# Patient Record
Sex: Female | Born: 2000 | Race: Black or African American | Hispanic: No | Marital: Single | Smoking: Never smoker
Health system: Southern US, Community
[De-identification: ages and names within clinical notes are randomized; demographics above are authoritative.]

## PROBLEM LIST (undated history)

## (undated) HISTORY — PX: WISDOM TOOTH EXTRACTION: SHX21

---
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
## 2018-04-24 ENCOUNTER — Ambulatory Visit (INDEPENDENT_AMBULATORY_CARE_PROVIDER_SITE_OTHER): Admitting: Family Medicine

## 2018-04-24 ENCOUNTER — Other Ambulatory Visit: Payer: Self-pay

## 2018-04-24 ENCOUNTER — Encounter: Payer: Self-pay | Admitting: Family Medicine

## 2018-04-24 VITALS — BP 112/68 | HR 60 | Temp 98.7°F | Resp 16 | Ht 65.0 in | Wt 124.4 lb

## 2018-04-24 DIAGNOSIS — Z0000 Encounter for general adult medical examination without abnormal findings: Secondary | ICD-10-CM

## 2018-04-24 NOTE — Progress Notes (Signed)
Subjective:    Patient ID: Megan Burke, female    DOB: 01-23-2001, 18 y.o.   MRN: [id number]  HPI   Patient presents to clinic to establish with PCP.  Currently has no complaints and would like complete physical exam.  Past medical history, surgical, social and family history reviewed and updated accordingly in chart.  Patient has no chronic medical conditions and is currently on no medications.  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  Vaccines are up-to-date for her age.  He does not smoke or drink alcohol, does not do drugs.  Sees the dentist twice per year, has not seen eye doctor since high school.  Currently wears no glasses or contacts.  History reviewed. No pertinent past medical history.   Family History  Problem Relation Age of Onset  . Depression Mother   . Hypertension Maternal Grandmother    History reviewed. No pertinent surgical history.   Social History   Tobacco Use  . Smoking status: Never Smoker  . Smokeless tobacco: Never Used  Substance Use Topics  . Alcohol use: Never    Frequency: Never   Review of Systems  Constitutional: Negative for chills, fatigue and fever.  HENT: Negative for congestion, ear pain, sinus pain and sore throat.   Eyes: Negative.   Respiratory: Negative for cough, shortness of breath and wheezing.   Cardiovascular: Negative for chest pain, palpitations and leg swelling.  Gastrointestinal: Negative for abdominal pain, diarrhea, nausea and vomiting.  Genitourinary: Negative for dysuria, frequency and urgency.  Musculoskeletal: Negative for arthralgias and myalgias.  Skin: Negative for color change, pallor and rash.  Neurological: Negative for syncope, light-headedness and headaches.  Psychiatric/Behavioral: The patient is not nervous/anxious.       Objective:   Physical Exam  Constitutional: She appears well-developed and well-nourished. No  distress.  HENT:  Head: Normocephalic and atraumatic.  Eyes: Pupils are equal, round, and reactive to light. EOM are normal. No scleral icterus.  Neck: Normal range of motion. Neck supple. No tracheal deviation present.  Cardiovascular: Normal rate, regular rhythm and normal heart sounds.  Pulmonary/Chest: Effort normal and breath sounds normal. No respiratory distress. She has no wheezes. She has no rales.  Abdominal: Soft. Bowel sounds are normal. There is no tenderness.  Neurological: She is alert and oriented to person, place, and time.  Gait normal  Skin: Skin is warm and dry. No pallor.  Psychiatric: She has a normal mood and affect. Her behavior is normal. Thought content normal.   Nursing note and vitals reviewed.   Today's Vitals   04/24/18 0939  BP: 112/68  Pulse: 60  Resp: 16  Temp: 98.7 F (37.1 C)  TempSrc: Oral  SpO2: 93%  Weight: 124 lb 6.4 oz (56.4 kg)  Height: 5\' 5"  (1.651 m)   Body mass index is 20.7 kg/m.     Assessment & Plan:   Well adult exam -offered to do screening labs for baseline numbers for CBC, CMP, thyroid, vitamin D and B12 levels however patient declines.  Declines any STD screenings.  Discussed healthy diet and regular exercise, always wearing seatbelt in vehicle, use of condoms for STD prevention and pregnancy prevention, safe sun practices including wearing SPF of at least 30 when outdoors, long sleeves and widebrimmed hat to protect skin.  Patient is a healthy 18 year old female.  Pap smear due next year  Patient will follow-up annually for physical exam, and will  return to clinic sooner if any issues arise.

## 2018-07-17 ENCOUNTER — Other Ambulatory Visit: Payer: Self-pay

## 2018-07-17 ENCOUNTER — Ambulatory Visit: Admitting: Family Medicine

## 2018-07-17 DIAGNOSIS — R21 Rash and other nonspecific skin eruption: Secondary | ICD-10-CM

## 2018-07-17 MED ORDER — METHYLPREDNISOLONE 4 MG PO TBPK: ORAL_TABLET | ORAL | 0 refills | Status: DC

## 2018-07-17 NOTE — Progress Notes (Signed)
Patient ID: Megan Burke, female   DOB: 06/09/2000, 18 y.o.   MRN: [id number]    Virtual Visit via video Note  This visit type was conducted due to national recommendations for restrictions regarding the COVID-19 pandemic (e.g. social distancing).  This format is felt to be most appropriate for this patient at this time.  All issues noted in this document were discussed and addressed.  No physical exam was performed (except for noted visual exam findings with Video Visits).   I connected with Megan Burke today at  2:00 PM EDT by a video enabled telemedicine application and verified that I am speaking with the correct person using two identifiers. Location patient: home Location provider: [facility] Persons participating in the virtual visit: patient, provider  I discussed the limitations, risks, security and privacy concerns of performing an evaluation and management service by video and the availability of in person appointments. I also discussed with the patient that there may be a patient responsible charge related to this service. The patient expressed understanding and agreed to proceed.  HPI:  Patient and I connected via video due to complaint of rash that is all over her stomach and back.  Patient denies any new soaps, detergents or lotions.  Has not been any new places or eaten new foods.  States she was over at her aunts home and did stay over there for a few days however she brought her own soap to use when showering.  As far as patient knows she has no allergies and has never been someone who has had super sensitive skin.  Denies any possibility of contact with poison ivy or poison oak.  No fever or chills.  No nausea or vomiting.  No pustules or blisters on skin.  No GI or GU complaints.  No cough, shortness of breath or wheezing.  ROS: See pertinent positives and negatives per HPI.  Family History  Problem Relation Age of Onset  . Depression Mother   . Hypertension  Maternal Grandmother     Social History   Tobacco Use  . Smoking status: Never Smoker  . Smokeless tobacco: Never Used  Substance Use Topics  . Alcohol use: Never    Frequency: Never    Current Outpatient Medications:  .  methylPREDNISolone (MEDROL) 4 MG TBPK tablet, Take according to pack instructions, Disp: 21 tablet, Rfl: 0  EXAM:  GENERAL: alert, oriented, appears well and in no acute distress  HEENT: atraumatic, conjunttiva clear, no obvious abnormalities on inspection of external nose and ears  NECK: normal movements of the head and neck  LUNGS: on inspection no signs of respiratory distress, breathing rate appears normal, no obvious gross SOB, gasping or wheezing  CV: no obvious cyanosis  MS: moves all visible extremities without noticeable abnormality  SKIN: Scattered faint, slightly raised maculopapular rash across patient's abdomen and back.  No obvious vesicles or pustules.  Skin does not appear angry red.  Patient states skin feels normal temperature.  PSYCH/NEURO: pleasant and cooperative, no obvious depression or anxiety, speech and thought processing grossly intact  ASSESSMENT AND PLAN:  Discussed the following assessment and plan:  Rash and nonspecific skin eruption - Plan: methylPREDNISolone (MEDROL) 4 MG TBPK tablet  Due to widespread area of the rash I would prefer not to use a topical steroid cream for fear of it discoloring patient's patient's skin.  She will take oral steroid taper and also has been advised to take a oral antihistamine daily such as Claritin or Allegra  to calm down the rash.  Advised to monitor cell for any worsening or rash and monitor cell for anything that potentially seems to make rash worse.  If rash does not clear up with use of steroid and oral antihistamine our next plan would be to refer to dermatology.  Patient verbalizes agreement of this plan.  I discussed the assessment and treatment plan with the patient. The patient was  provided an opportunity to ask questions and all were answered. The patient agreed with the plan and demonstrated an understanding of the instructions.   The patient was advised to call back or seek an in-person evaluation if the symptoms worsen or if the condition fails to improve as anticipated.   Tracey Harries, FNP

## 2019-04-26 ENCOUNTER — Encounter: Admitting: Family Medicine

## 2021-01-09 ENCOUNTER — Telehealth: Payer: Self-pay | Admitting: Adult Health

## 2021-01-09 ENCOUNTER — Encounter: Payer: Self-pay | Admitting: Adult Health

## 2021-01-09 ENCOUNTER — Ambulatory Visit (INDEPENDENT_AMBULATORY_CARE_PROVIDER_SITE_OTHER): Admitting: Adult Health

## 2021-01-09 ENCOUNTER — Other Ambulatory Visit: Payer: Self-pay

## 2021-01-09 VITALS — BP 124/78 | HR 92 | Temp 97.5°F | Ht 65.0 in | Wt 139.4 lb

## 2021-01-09 DIAGNOSIS — K219 Gastro-esophageal reflux disease without esophagitis: Secondary | ICD-10-CM | POA: Insufficient documentation

## 2021-01-09 DIAGNOSIS — R197 Diarrhea, unspecified: Secondary | ICD-10-CM | POA: Diagnosis not present

## 2021-01-09 DIAGNOSIS — R1011 Right upper quadrant pain: Secondary | ICD-10-CM | POA: Insufficient documentation

## 2021-01-09 DIAGNOSIS — R630 Anorexia: Secondary | ICD-10-CM | POA: Diagnosis not present

## 2021-01-09 DIAGNOSIS — R112 Nausea with vomiting, unspecified: Secondary | ICD-10-CM | POA: Insufficient documentation

## 2021-01-09 LAB — COMPREHENSIVE METABOLIC PANEL
ALT: 7 U/L (ref 0–35)
AST: 14 U/L (ref 0–37)
Albumin: 5 g/dL (ref 3.5–5.2)
Alkaline Phosphatase: 42 U/L (ref 39–117)
BUN: 8 mg/dL (ref 6–23)
CO2: 26 mEq/L (ref 19–32)
Calcium: 10.2 mg/dL (ref 8.4–10.5)
Chloride: 100 mEq/L (ref 96–112)
Creatinine, Ser: 0.77 mg/dL (ref 0.40–1.20)
GFR: 109.25 mL/min (ref >60.00)
Glucose, Bld: 75 mg/dL (ref 70–99)
Potassium: 3.7 mEq/L (ref 3.5–5.1)
Sodium: 137 mEq/L (ref 135–145)
Total Bilirubin: 1 mg/dL (ref 0.2–1.2)
Total Protein: 7.8 g/dL (ref 6.0–8.3)

## 2021-01-09 LAB — TSH: TSH: 0.6 u[IU]/mL (ref 0.35–5.50)

## 2021-01-09 LAB — LIPID PANEL
Cholesterol: 192 mg/dL (ref 0–200)
HDL: 47.1 mg/dL (ref >39.00)
LDL Cholesterol: 131 mg/dL — ABNORMAL HIGH (ref 0–99)
NonHDL: 145.34
Total CHOL/HDL Ratio: 4
Triglycerides: 74 mg/dL (ref 0.0–149.0)
VLDL: 14.8 mg/dL (ref 0.0–40.0)

## 2021-01-09 LAB — CBC WITH DIFFERENTIAL/PLATELET
Basophils Absolute: 0 10*3/uL (ref 0.0–0.1)
Basophils Relative: 1.1 % (ref 0.0–3.0)
Eosinophils Absolute: 0 10*3/uL (ref 0.0–0.7)
Eosinophils Relative: 0.7 % (ref 0.0–5.0)
HCT: 44.9 % (ref 36.0–46.0)
Hemoglobin: 14.8 g/dL (ref 12.0–15.0)
Lymphocytes Relative: 29.9 % (ref 12.0–46.0)
Lymphs Abs: 1 10*3/uL (ref 0.7–4.0)
MCHC: 33 g/dL (ref 30.0–36.0)
MCV: 94.6 fl (ref 78.0–100.0)
Monocytes Absolute: 0.2 10*3/uL (ref 0.1–1.0)
Monocytes Relative: 5.5 % (ref 3.0–12.0)
Neutro Abs: 2.2 10*3/uL (ref 1.4–7.7)
Neutrophils Relative %: 62.8 % (ref 43.0–77.0)
Platelets: 215 10*3/uL (ref 150.0–400.0)
RBC: 4.74 Mil/uL (ref 3.87–5.11)
RDW: 13.2 % (ref 11.5–15.5)
WBC: 3.5 10*3/uL — ABNORMAL LOW (ref 4.0–10.5)

## 2021-01-09 LAB — LIPASE: Lipase: 26 U/L (ref 11.0–59.0)

## 2021-01-09 LAB — AMYLASE: Amylase: 57 U/L (ref 27–131)

## 2021-01-09 LAB — HCG, QUANTITATIVE, PREGNANCY: Quantitative HCG: <0.6 m[IU]/mL

## 2021-01-09 MED ORDER — PANTOPRAZOLE SODIUM 20 MG PO TBEC: 20.0000 mg | DELAYED_RELEASE_TABLET | Freq: Every day | ORAL | 0 refills | Status: DC

## 2021-01-09 NOTE — Telephone Encounter (Signed)
Lft sms alert for pt to call ofc to sch [place]. thanks

## 2021-01-09 NOTE — Progress Notes (Signed)
CMP is within normal limits.  Amylase and lipase within normal.  CMP within normal.  LDL elevated.  Discuss lifestyle modification with patient e.g. increase exercise, fiber, fruits, vegetables, lean meat, and omega 3/fish intake and decrease saturated fat.  If patient following strict diet and exercise program already please schedule follow up appointment with primary care physician  TSH is low end normal.  White blood cell count slightly low, will recheck.  Recheck  CBC and TSH in 4- 6 weeks.  please schedule.  HCG for pregnancy is negative.

## 2021-01-09 NOTE — Patient Instructions (Signed)
Food Choices for Gastroesophageal Reflux Disease, Adult When you have gastroesophageal reflux disease (GERD), the foods you eat and your eating habits are very important. Choosing the right foods can help ease your discomfort. Think about working with a food expert (dietitian) to help you make good choices. What are tips for following this plan? Reading food labels Look for foods that are low in saturated fat. Foods that may help with your symptoms include: Foods that have less than 5% of daily value (DV) of fat. Foods that have 0 grams of trans fat. Cooking Do not fry your food. Cook your food by baking, steaming, grilling, or broiling. These are all methods that do not need a lot of fat for cooking. To add flavor, try to use herbs that are low in spice and acidity. Meal planning  Choose healthy foods that are low in fat, such as: Fruits and vegetables. Whole grains. Low-fat dairy products. Lean meats, fish, and poultry. Eat small meals often instead of eating 3 large meals each day. Eat your meals slowly in a place where you are relaxed. Avoid bending over or lying down until 2-3 hours after eating. Limit high-fat foods such as fatty meats or fried foods. Limit your intake of fatty foods, such as oils, butter, and shortening. Avoid the following as told by your doctor: Foods that cause symptoms. These may be different for different people. Keep a food diary to keep track of foods that cause symptoms. Alcohol. Drinking a lot of liquid with meals. Eating meals during the 2-3 hours before bed. Lifestyle Stay at a healthy weight. Ask your doctor what weight is healthy for you. If you need to lose weight, work with your doctor to do so safely. Exercise for at least 30 minutes on 5 or more days each week, or as told by your doctor. Wear loose-fitting clothes. Do not smoke or use any products that contain nicotine or tobacco. If you need help quitting, ask your doctor. Sleep with the head  of your bed higher than your feet. Use a wedge under the mattress or blocks under the bed frame to raise the head of the bed. Chew sugar-free gum after meals. What foods should eat? Eat a healthy, well-balanced diet of fruits, vegetables, whole grains, low-fat dairy products, lean meats, fish, and poultry. Each person is different. Foods that may cause symptoms in one person may not cause any symptoms in another person. Work with your doctor to find foods that are safe for you. The items listed above may not be a complete list of what you can eat and drink. Contact a food expert for more options. What foods should I avoid? Limiting some of these foods may help in managing the symptoms of GERD. Everyone is different. Talk with a food expert or your doctor to help you find the exact foods to avoid, if any. Fruits Any fruits prepared with added fat. Any fruits that cause symptoms. For some people, this may include citrus fruits, such as oranges, grapefruit, pineapple, and lemons. Vegetables Deep-fried vegetables. French fries. Any vegetables prepared with added fat. Any vegetables that cause symptoms. For some people, this may include tomatoes and tomato products, chili peppers, onions and garlic, and horseradish. Grains Pastries or quick breads with added fat. Meats and other proteins High-fat meats, such as fatty beef or pork, hot dogs, ribs, ham, sausage, salami, and bacon. Fried meat or protein, including fried fish and fried chicken. Nuts and nut butters, in large amounts. Dairy Whole milk   and chocolate milk. Sour cream. Cream. Ice cream. Cream cheese. Milkshakes. Fats and oils Butter. Margarine. Shortening. Ghee. Beverages Coffee and tea, with or without caffeine. Carbonated beverages. Sodas. Energy drinks. Fruit juice made with acidic fruits, such as orange or grapefruit. Tomato juice. Alcoholic drinks. Sweets and desserts Chocolate and cocoa. Donuts. Seasonings and condiments Pepper.  Peppermint and spearmint. Added salt. Any condiments, herbs, or seasonings that cause symptoms. For some people, this may include curry, hot sauce, or vinegar-based salad dressings. The items listed above may not be a complete list of what you should not eat and drink. Contact a food expert for more options. Questions to ask your doctor Diet and lifestyle changes are often the first steps that are taken to manage symptoms of GERD. If diet and lifestyle changes do not help, talk with your doctor about taking medicines. Where to find more information International [employer] for Gastrointestinal [employer]: aboutgerd.org Summary When you have GERD, food and lifestyle choices are very important in easing your symptoms. Eat small meals often instead of 3 large meals a day. Eat your meals slowly and in a place where you are relaxed. Avoid bending over or lying down until 2-3 hours after eating. Limit high-fat foods such as fatty meats or fried foods. This information is not intended to replace advice given to you by your health care provider. Make sure you discuss any questions you have with your health care provider. Document Revised: 08/09/2019 Document Reviewed: 08/09/2019 Elsevier Patient Education  2022 [employer] Nausea, Adult Nausea is feeling like you may vomit. Feeling like you may vomit is usually not serious, but it may be an early sign of a more serious medical problem. Vomiting is when stomach contents forcefully come out of your mouth. If you vomit, or if you are not able to drink enough fluids, you may not have enough water in your body (get dehydrated). If you do not have enough water in your body, you may: Feel tired. Feel thirsty. Have a dry mouth. Have cracked lips. Pee (urinate) less often. Older adults and people who have other diseases or a weak body defense system (immune system) have a higher risk of not having enough water in the body. The main goals of treating this  condition are: To relieve your nausea. To ensure your nausea occurs less often. To prevent vomiting and losing too much fluid. Follow these instructions at home: Watch your symptoms for any changes. Tell your doctor about them. Eating and drinking   Take an ORS (oral rehydration solution). This is a drink that is sold at pharmacies and stores. Drink clear fluids in small amounts as you are able. These include: Water. Ice chips. Fruit juice that has water added (diluted fruit juice). Low-calorie sports drinks. Eat bland, easy-to-digest foods in small amounts as you are able, such as: Bananas. Applesauce. Rice. Low-fat (lean) meats. Toast. Crackers. Avoid drinking fluids that have a lot of sugar or caffeine in them. This includes energy drinks, sports drinks, and soda. Avoid alcohol. Avoid spicy or fatty foods. General instructions Take over-the-counter and prescription medicines only as told by your doctor. Rest at home while you get better. Drink enough fluid to keep your pee (urine) pale yellow. Take slow and deep breaths when you feel like you may vomit. Avoid food or things that have strong smells. Wash your hands often with soap and water for at least 20 seconds. If you cannot use soap and water, use hand sanitizer. Make sure that everyone in  your home washes their hands well and often. Keep all follow-up visits. Contact a doctor if: You feel worse. You feel like you may vomit and this lasts for more than 2 days. You vomit. You are not able to drink fluids without vomiting. You have new symptoms. You have a fever. You have a headache. You have muscle cramps. You have a rash. You have pain while peeing. You feel light-headed or dizzy. Get help right away if: You have pain in your chest, neck, arm, or jaw. You feel very weak or you faint. You have vomit that is bright red or looks like coffee grounds. You have bloody or black poop (stools) or poop that looks like  tar. You have a very bad headache, a stiff neck, or both. You have very bad pain, cramping, or bloating in your belly (abdomen). You have trouble breathing or you are breathing very quickly. Your heart is beating very quickly. Your skin feels cold and clammy. You feel confused. You have signs of losing too much water in your body, such as: Dark pee, very little pee, or no pee. Cracked lips. Dry mouth. Sunken eyes. Sleepiness. Weakness. These symptoms may be an emergency. Get help right away. Call 911. Do not wait to see if the symptoms will go away. Do not drive yourself to the hospital. Summary Nausea is feeling like you are about vomit. If you vomit, or if you are not able to drink enough fluids, you may not have enough water in your body (get dehydrated). Eat and drink what your doctor tells you. Take over-the-counter and prescription medicines only as told by your doctor. Contact a doctor right away if your symptoms get worse or you have new symptoms. Keep all follow-up visits. This information is not intended to replace advice given to you by your health care provider. Make sure you discuss any questions you have with your health care provider. Document Revised: 08/04/2020 Document Reviewed: 08/04/2020 Elsevier Patient Education  2022 [employer]. Diarrhea, Adult Diarrhea is when you pass loose and watery poop (stool) often. Diarrhea can make you feel weak and cause you to lose water in your body (get dehydrated). Losing water in your body can cause you to: Feel tired and thirsty. Have a dry mouth. Go pee (urinate) less often. Diarrhea often lasts 2-3 days. However, it can last longer if it is a sign of something more serious. It is important to treat your diarrhea as told by your doctor. Follow these instructions at home: Eating and drinking   Follow these instructions as told by your doctor: Take an ORS (oral rehydration solution). This is a drink that helps you replace  fluids and minerals your body lost. It is sold at pharmacies and stores. Drink plenty of fluids, such as: Water. Ice chips. Diluted fruit juice. Low-calorie sports drinks. Milk, if you want. Avoid drinking fluids that have a lot of sugar or caffeine in them. Eat bland, easy-to-digest foods in small amounts as you are able. These foods include: Bananas. Applesauce. Rice. Low-fat (lean) meats. Toast. Crackers. Avoid alcohol. Avoid spicy or fatty foods.  Medicines Take over-the-counter and prescription medicines only as told by your doctor. If you were prescribed an antibiotic medicine, take it as told by your doctor. Do not stop using the antibiotic even if you start to feel better. General instructions  Wash your hands often using soap and water. If soap and water are not available, use a hand sanitizer. Others in your home should wash  their hands as well. Hands should be washed: After using the toilet or changing a diaper. Before preparing, cooking, or serving food. While caring for a sick person. While visiting someone in a hospital. Drink enough fluid to keep your pee (urine) pale yellow. Rest at home while you get better. Take a warm bath to help with any burning or pain from having diarrhea. Watch your condition for any changes. Keep all follow-up visits as told by your doctor. This is important. Contact a doctor if: You have a fever. Your diarrhea gets worse. You have new symptoms. You cannot keep fluids down. You feel light-headed or dizzy. You have a headache. You have muscle cramps. Get help right away if: You have chest pain. You feel very weak or you pass out (faint). You have bloody or black poop or poop that looks like tar. You have very bad pain, cramping, or bloating in your belly (abdomen). You have trouble breathing or you are breathing very quickly. Your heart is beating very quickly. Your skin feels cold and clammy. You feel confused. You have signs  of losing too much water in your body, such as: Dark pee, very little pee, or no pee. Cracked lips. Dry mouth. Sunken eyes. Sleepiness. Weakness. Summary Diarrhea is when you pass loose and watery poop (stool) often. Diarrhea can make you feel weak and cause you to lose water in your body (get dehydrated). Take an ORS (oral rehydration solution). This is a drink that is sold at pharmacies and stores. Eat bland, easy-to-digest foods in small amounts as you are able. Contact a doctor if your condition gets worse. Get help right away if you have signs that you have lost too much water in your body. This information is not intended to replace advice given to you by your health care provider. Make sure you discuss any questions you have with your health care provider. Document Revised: 08/09/2020 Document Reviewed: 08/09/2020 Elsevier Patient Education  2022 [employer].

## 2021-01-09 NOTE — Progress Notes (Signed)
New Patient Office Visit  Subjective:  Patient ID: Megan Burke, female    DOB: September 16, 2000  Age: 20 y.o. MRN: [id number]  CC:  Chief Complaint  Patient presents with   Transitions Of Care    HPI Megan Burke presents for new patient transfer of care. She reports she started having nausea, loss of appetite,  spitting up mucous in the mornings. Eating very little. Onset since 12/11/2020. Denies anything making it better or worse. She does report some reflux. She would gag with brushing teeth.  She reports that her nausea and spitting up is worse in the mornings when she wakes up.  She denies ever having any gastric or esophageal reflux disease. She has not seen another provider for this.   Patient's last menstrual period was 01/01/2021 (exact date).denies any pregnancy.   Denies any urinary symptoms, denies any bowel or bladder incontinence She has loose stools every other day. Green color to bowel movements at times she reports.  She does report she has some right-sided abdominal pain.  Currently at visit she reports pain is 0 out of 10.  Denies any previous abdominal surgeries.  Patient  denies any fever, body aches,chills, rash, chest pain, shortness of breath, nausea, vomiting, or diarrhea.    No past medical history on file.  No past surgical history on file.  Family History  Problem Relation Age of Onset   Depression Mother    Hypertension Maternal Grandmother     Social History   Socioeconomic History   Marital status: Single    Spouse name: Not on file   Number of children: Not on file   Years of education: Not on file   Highest education level: Not on file  Occupational History   Not on file  Tobacco Use   Smoking status: Never   Smokeless tobacco: Never  Vaping Use   Vaping Use: Never used  Substance and Sexual Activity   Alcohol use: Never   Drug use: Never   Sexual activity: Never    Birth control/protection: None  Other Topics Concern   Not  on file  Social History Narrative   Not on file   Social Determinants of Health   Financial Resource Strain: Not on file  Food Insecurity: Not on file  Transportation Needs: Not on file  Physical Activity: Not on file  Stress: Not on file  Social Connections: Not on file  Intimate Partner Violence: Not on file    ROS Review of Systems  Constitutional:  Positive for appetite change, fatigue and unexpected weight change. Negative for activity change, chills, diaphoresis and fever.  HENT: Negative.    Respiratory: Negative.    Cardiovascular: Negative.   Gastrointestinal:  Positive for abdominal pain, diarrhea and nausea. Negative for abdominal distention, anal bleeding, blood in stool, constipation and rectal pain.  Genitourinary: Negative.   Musculoskeletal: Negative.   Skin: Negative.   Neurological: Negative.   Hematological: Negative.   Psychiatric/Behavioral:  Negative for self-injury, sleep disturbance and suicidal ideas. The patient is nervous/anxious.    Objective:   Today's Vitals: BP 124/78   Pulse 92   Temp (!) 97.5 F (36.4 C) (Skin)   Ht 5\' 5"  (1.651 m)   Wt 139 lb 6.4 oz (63.2 kg)   LMP 01/01/2021 (Exact Date)   SpO2 99%   BMI 23.20 kg/m  Filed Weights   01/09/21 1106  Weight: 139 lb 6.4 oz (63.2 kg)    Physical Exam Vitals reviewed.  Constitutional:  General: She is not in acute distress.    Appearance: She is not ill-appearing, toxic-appearing or diaphoretic.  HENT:     Head: Normocephalic and atraumatic.     Right Ear: Tympanic membrane, ear canal and external ear normal. There is no impacted cerumen.     Left Ear: Tympanic membrane, ear canal and external ear normal. There is no impacted cerumen.     Nose: Nose normal. No congestion or rhinorrhea.     Mouth/Throat:     Mouth: Mucous membranes are moist.     Pharynx: No oropharyngeal exudate or posterior oropharyngeal erythema.  Eyes:     General: No scleral icterus.       Right eye: No  discharge.        Left eye: No discharge.     Extraocular Movements: Extraocular movements intact.  Cardiovascular:     Rate and Rhythm: Normal rate and regular rhythm.     Pulses: Normal pulses.     Heart sounds: Normal heart sounds.  Pulmonary:     Effort: Pulmonary effort is normal.     Breath sounds: Normal breath sounds.  Abdominal:     General: There is no distension.     Palpations: Abdomen is soft. There is no mass.     Tenderness: There is abdominal tenderness in the right upper quadrant and epigastric area. There is no right CVA tenderness, left CVA tenderness, guarding or rebound. Negative signs include Murphy's sign, McBurney's sign and obturator sign.     Hernia: No hernia is present.    Musculoskeletal:        General: No swelling. Normal range of motion.     Cervical back: Normal range of motion and neck supple.     Right lower leg: No edema.     Left lower leg: No edema.  Skin:    General: Skin is warm and dry.  Neurological:     Mental Status: She is oriented to person, place, and time.     Cranial Nerves: No cranial nerve deficit.     Motor: No weakness.     Gait: Gait normal.  Psychiatric:        Attention and Perception: Attention and perception normal.        Mood and Affect: Affect normal. Mood is anxious.        Speech: Speech normal.        Behavior: Behavior normal.        Thought Content: Thought content normal.        Cognition and Memory: Cognition and memory normal.        Judgment: Judgment normal.    Assessment & Plan:   Problem List Items Addressed This Visit       Digestive   Gastroesophageal reflux disease   Relevant Medications   pantoprazole (PROTONIX) 20 MG tablet   Nausea and vomiting   Relevant Orders   hCG, quantitative, pregnancy   Lipid Profile   US Abdomen Limited RUQ (LIVER/GB)     Other   Diarrhea   Relevant Orders   US Abdomen Limited RUQ (LIVER/GB)   Stool Culture   Decreased appetite - Primary   Relevant  Medications   pantoprazole (PROTONIX) 20 MG tablet   Other Relevant Orders   TSH   hCG, quantitative, pregnancy   US Abdomen Limited RUQ (LIVER/GB)   RUQ pain   Relevant Orders   CBC with Differential/Platelet   Comprehensive metabolic panel   Amylase   Lipase  Outpatient Encounter Medications as of 01/09/2021  Medication Sig   pantoprazole (PROTONIX) 20 MG tablet Take 1 tablet (20 mg total) by mouth daily.   [DISCONTINUED] methylPREDNISolone (MEDROL) 4 MG TBPK tablet Take according to pack instructions   No facility-administered encounter medications on file as of 01/09/2021.    Red Flags discussed. The patient was given clear instructions to go to ER or return to medical center if any red flags develop, symptoms do not improve, worsen or new problems develop. They verbalized understanding.   Follow-up: Return in about 2 weeks (around 01/23/2021), or if symptoms worsen or fail to improve, for at any time for any worsening symptoms.   Jairo Ben, FNP

## 2021-01-12 ENCOUNTER — Encounter: Payer: Self-pay | Admitting: *Deleted

## 2021-01-16 ENCOUNTER — Other Ambulatory Visit: Payer: Self-pay

## 2021-01-16 ENCOUNTER — Ambulatory Visit
Admission: RE | Admit: 2021-01-16 | Discharge: 2021-01-16 | Disposition: A | Discharge status: Home / Self Care | Source: Ambulatory Visit | Attending: Adult Health | Admitting: Adult Health

## 2021-01-16 DIAGNOSIS — R197 Diarrhea, unspecified: Secondary | ICD-10-CM | POA: Diagnosis present

## 2021-01-16 DIAGNOSIS — R630 Anorexia: Secondary | ICD-10-CM | POA: Insufficient documentation

## 2021-01-16 DIAGNOSIS — R112 Nausea with vomiting, unspecified: Secondary | ICD-10-CM | POA: Insufficient documentation

## 2021-01-17 NOTE — Progress Notes (Signed)
Ultrasound within normal limits RUQ.

## 2021-01-23 ENCOUNTER — Ambulatory Visit: Admitting: Adult Health

## 2021-01-25 ENCOUNTER — Ambulatory Visit (INDEPENDENT_AMBULATORY_CARE_PROVIDER_SITE_OTHER): Admitting: Adult Health

## 2021-01-25 ENCOUNTER — Other Ambulatory Visit: Payer: Self-pay

## 2021-01-25 ENCOUNTER — Encounter: Payer: Self-pay | Admitting: Adult Health

## 2021-01-25 VITALS — BP 118/76 | HR 100 | Ht 65.0 in | Wt 138.8 lb

## 2021-01-25 DIAGNOSIS — R630 Anorexia: Secondary | ICD-10-CM

## 2021-01-25 DIAGNOSIS — K219 Gastro-esophageal reflux disease without esophagitis: Secondary | ICD-10-CM | POA: Diagnosis not present

## 2021-01-25 MED ORDER — PANTOPRAZOLE SODIUM 20 MG PO TBEC: 20.0000 mg | DELAYED_RELEASE_TABLET | Freq: Every day | ORAL | 0 refills | Status: DC

## 2021-01-25 NOTE — Patient Instructions (Signed)
Pantoprazole Tablets What is this medication? PANTOPRAZOLE (pan TOE pra zole) treats heartburn, stomach ulcers, reflux disease, or other conditions that cause too much stomach acid. It works by reducing the amount of acid in the stomach. It belongs to a group of medications called PPIs. This medicine may be used for other purposes; ask your health care provider or pharmacist if you have questions. COMMON BRAND NAME(S): Protonix What should I tell my care team before I take this medication? They need to know if you have any of these conditions: Liver disease Low levels of calcium, magnesium, or potassium in the blood Lupus An unusual or allergic reaction to pantoprazole, other medication, foods, dyes, or preservatives Pregnant or trying to get pregnant Breast-feeding How should I use this medication? Take this medication by mouth. Swallow the tablets whole with a drink of water. Follow the directions on the prescription label. Do not crush, break, or chew. Take your medication at regular intervals. Do not take your medication more often than directed. A special MedGuide will be given to you by the pharmacist with each prescription and refill. Be sure to read this information carefully each time. Talk to your care team about the use of this medication in children. While this medication may be prescribed for children as young as 5 years for selected conditions, precautions do apply. Overdosage: If you think you have taken too much of this medicine contact a poison control center or emergency room at once. NOTE: This medicine is only for you. Do not share this medicine with others. What if I miss a dose? If you miss a dose, take it as soon as you can. If it is almost time for your next dose, take only that dose. Do not take double or extra doses. What may interact with this medication? Do not take this medication with any of the following: Atazanavir Nelfinavir This medication may also interact  with the following: Ampicillin Delavirdine Erlotinib Iron salts Medications for fungal infections like ketoconazole, itraconazole and voriconazole Methotrexate Mycophenolate mofetil Warfarin This list may not describe all possible interactions. Give your health care provider a list of all the medicines, herbs, non-prescription drugs, or dietary supplements you use. Also tell them if you smoke, drink alcohol, or use illegal drugs. Some items may interact with your medicine. What should I watch for while using this medication? It can take several days before your stomach pain gets better. Check with your care team if your condition does not start to get better, or if it gets worse. Do not treat diarrhea with over the counter products. Contact your care team if you have diarrhea that lasts more than 2 days or if it is severe and watery. You may need blood work done while you are taking this medication. Using this medication for a long time may weaken your bones. The risk of bone fractures may be increased. Talk to your care team about your bone health. Using this medication for a long time may cause growths (polyps) in the stomach. They usually don't cause any symptoms. They are usually not cancerous. Contact your care team if you notice pain or tenderness when you press your stomach, have nausea, or see bloody or black, tar-like stools. This medication may cause a decrease in vitamin B12. You should make sure that you get enough vitamin B12 while you are taking this medication. Discuss the foods you eat and the vitamins you take with your care team. What side effects may I notice from receiving  this medication? Side effects that you should report to your care team as soon as possible: Allergic reactions--skin rash, itching, hives, swelling of the face, lips, tongue, or throat Kidney injury--decrease in the amount of urine, swelling of the ankles, hands, or feet Low magnesium level--muscle pain or  cramps, unusual weakness or fatigue, fast or irregular heartbeat, tremors Low vitamin B12 level--pain, tingling, or numbness in the hands or feet, muscle weakness, dizziness, confusion, difficulty concentrating Rash on the cheeks or ams that gets worse in the sun Redness, blistering, peeling, or loosening of the skin, including inside the mouth Severe diarrhea, fever Unusual bruising or bleeding Side effects that usually do not require medical attention (report to your care team if they continue or are bothersome): Diarrhea Headache Vomiting This list may not describe all possible side effects. Call your doctor for medical advice about side effects. You may report side effects to FDA at 1-800-FDA-1088. Where should I keep my medication? Keep out of the reach of children and pets. Store at room temperature between 15 and 30 degrees C (59 and 86 degrees F). Protect from light and moisture. Throw away any unused medication after the expiration date. NOTE: This sheet is a summary. It may not cover all possible information. If you have questions about this medicine, talk to your doctor, pharmacist, or health care provider.  2022 Elsevier/Gold Standard (2020-04-25 00:00:00) Food Choices for Gastroesophageal Reflux Disease, Adult When you have gastroesophageal reflux disease (GERD), the foods you eat and your eating habits are very important. Choosing the right foods can help ease your discomfort. Think about working with a food expert (dietitian) to help you make good choices. What are tips for following this plan? Reading food labels Look for foods that are low in saturated fat. Foods that may help with your symptoms include: Foods that have less than 5% of daily value (DV) of fat. Foods that have 0 grams of trans fat. Cooking Do not fry your food. Cook your food by baking, steaming, grilling, or broiling. These are all methods that do not need a lot of fat for cooking. To add flavor, try to  use herbs that are low in spice and acidity. Meal planning  Choose healthy foods that are low in fat, such as: Fruits and vegetables. Whole grains. Low-fat dairy products. Lean meats, fish, and poultry. Eat small meals often instead of eating 3 large meals each day. Eat your meals slowly in a place where you are relaxed. Avoid bending over or lying down until 2-3 hours after eating. Limit high-fat foods such as fatty meats or fried foods. Limit your intake of fatty foods, such as oils, butter, and shortening. Avoid the following as told by your doctor: Foods that cause symptoms. These may be different for different people. Keep a food diary to keep track of foods that cause symptoms. Alcohol. Drinking a lot of liquid with meals. Eating meals during the 2-3 hours before bed. Lifestyle Stay at a healthy weight. Ask your doctor what weight is healthy for you. If you need to lose weight, work with your doctor to do so safely. Exercise for at least 30 minutes on 5 or more days each week, or as told by your doctor. Wear loose-fitting clothes. Do not smoke or use any products that contain nicotine or tobacco. If you need help quitting, ask your doctor. Sleep with the head of your bed higher than your feet. Use a wedge under the mattress or blocks under the bed frame to  raise the head of the bed. Chew sugar-free gum after meals. What foods should eat? Eat a healthy, well-balanced diet of fruits, vegetables, whole grains, low-fat dairy products, lean meats, fish, and poultry. Each person is different. Foods that may cause symptoms in one person may not cause any symptoms in another person. Work with your doctor to find foods that are safe for you. The items listed above may not be a complete list of what you can eat and drink. Contact a food expert for more options. What foods should I avoid? Limiting some of these foods may help in managing the symptoms of GERD. Everyone is different. Talk with a  food expert or your doctor to help you find the exact foods to avoid, if any. Fruits Any fruits prepared with added fat. Any fruits that cause symptoms. For some people, this may include citrus fruits, such as oranges, grapefruit, pineapple, and lemons. Vegetables Deep-fried vegetables. [place] fries. Any vegetables prepared with added fat. Any vegetables that cause symptoms. For some people, this may include tomatoes and tomato products, chili peppers, onions and garlic, and horseradish. Grains Pastries or quick breads with added fat. Meats and other proteins High-fat meats, such as fatty beef or pork, hot dogs, ribs, ham, sausage, salami, and bacon. Fried meat or protein, including fried fish and fried chicken. Nuts and nut butters, in large amounts. Dairy Whole milk and chocolate milk. Sour cream. Cream. Ice cream. Cream cheese. Milkshakes. Fats and oils Butter. Margarine. Shortening. Ghee. Beverages Coffee and tea, with or without caffeine. Carbonated beverages. Sodas. Energy drinks. Fruit juice made with acidic fruits, such as orange or grapefruit. Tomato juice. Alcoholic drinks. Sweets and desserts Chocolate and cocoa. Donuts. Seasonings and condiments Pepper. Peppermint and spearmint. Added salt. Any condiments, herbs, or seasonings that cause symptoms. For some people, this may include curry, hot sauce, or vinegar-based salad dressings. The items listed above may not be a complete list of what you should not eat and drink. Contact a food expert for more options. Questions to ask your doctor Diet and lifestyle changes are often the first steps that are taken to manage symptoms of GERD. If diet and lifestyle changes do not help, talk with your doctor about taking medicines. Where to find more information [employer]: aboutgerd.org Summary When you have GERD, food and lifestyle choices are very important in easing your symptoms. Eat small  meals often instead of 3 large meals a day. Eat your meals slowly and in a place where you are relaxed. Avoid bending over or lying down until 2-3 hours after eating. Limit high-fat foods such as fatty meats or fried foods. This information is not intended to replace advice given to you by your health care provider. Make sure you discuss any questions you have with your health care provider. Document Revised: 08/09/2019 Document Reviewed: 08/09/2019 Elsevier Patient Education  2022 [employer].

## 2021-01-25 NOTE — Progress Notes (Signed)
Established Patient Office Visit  Subjective:  Patient ID: Megan Burke, female    DOB: 09-Nov-2000  Age: 20 y.o. MRN: [id number]  CC:  Chief Complaint  Patient presents with   Follow-up    HPI Megan Burke presents for  follow GERD decreased appetite. She was placed Pantoprazole 20 mg once daily on 01/09/21 at the time she had epigastric and RUQ pain, this is resolved now, she denies any pain and reports her appetite has once again returned to normal.   RUQ [place] results  IMPRESSION: No acute abnormality noted. Electronically Signed   By: Alcide Clever M.D.   On: 01/17/2021 00:02   Denies any symptoms today , feels well.  Patient  denies any fever, body aches,chills, rash, chest pain, shortness of breath, nausea, vomiting, or diarrhea.  Denies dizziness, lightheadedness, pre syncopal or syncopal episodes.   Patient's last menstrual period was 01/01/2021 (exact date).  No past medical history on file.  No past surgical history on file.  Family History  Problem Relation Age of Onset   Depression Mother    Hypertension Maternal Grandmother     Social History   Socioeconomic History   Marital status: Single    Spouse name: Not on file   Number of children: Not on file   Years of education: Not on file   Highest education level: Not on file  Occupational History   Not on file  Tobacco Use   Smoking status: Never   Smokeless tobacco: Never  Vaping Use   Vaping Use: Never used  Substance and Sexual Activity   Alcohol use: Never   Drug use: Never   Sexual activity: Never    Birth control/protection: None  Other Topics Concern   Not on file  Social History Narrative   Not on file   Social Determinants of Health   Financial Resource Strain: Not on file  Food Insecurity: Not on file  Transportation Needs: Not on file  Physical Activity: Not on file  Stress: Not on file  Social Connections: Not on file  Intimate Partner Violence: Not on file     Outpatient Medications Prior to Visit  Medication Sig Dispense Refill   pantoprazole (PROTONIX) 20 MG tablet Take 1 tablet (20 mg total) by mouth daily. 30 tablet 0   No facility-administered medications prior to visit.    No Known Allergies  ROS Review of Systems  Constitutional: Negative.   HENT: Negative.    Respiratory: Negative.    Cardiovascular: Negative.   Gastrointestinal: Negative.   Genitourinary: Negative.   Musculoskeletal: Negative.   Neurological: Negative.   Psychiatric/Behavioral: Negative.       Objective:    Physical Exam Vitals reviewed.  Constitutional:      General: She is not in acute distress.    Appearance: She is well-developed. She is not diaphoretic.     Interventions: She is not intubated. HENT:     Head: Normocephalic and atraumatic.     Right Ear: External ear normal.     Left Ear: External ear normal.     Nose: Nose normal.     Mouth/Throat:     Pharynx: No oropharyngeal exudate.  Eyes:     General: Lids are normal. No scleral icterus.       Right eye: No discharge.        Left eye: No discharge.     Conjunctiva/sclera: Conjunctivae normal.     Right eye: Right conjunctiva is not injected. No exudate or  hemorrhage.    Left eye: Left conjunctiva is not injected. No exudate or hemorrhage.    Pupils: Pupils are equal, round, and reactive to light.  Neck:     Thyroid: No thyroid mass or thyromegaly.     Vascular: Normal carotid pulses. No carotid bruit, hepatojugular reflux or JVD.     Trachea: Trachea and phonation normal. No tracheal tenderness or tracheal deviation.     Meningeal: Brudzinski's sign and Kernig's sign absent.  Cardiovascular:     Rate and Rhythm: Normal rate and regular rhythm.     Pulses: Normal pulses.          Radial pulses are 2+ on the right side and 2+ on the left side.       Dorsalis pedis pulses are 2+ on the right side and 2+ on the left side.       Posterior tibial pulses are 2+ on the right side and  2+ on the left side.     Heart sounds: Normal heart sounds, S1 normal and S2 normal. Heart sounds not distant. No murmur heard.   No friction rub. No gallop.  Pulmonary:     Effort: Pulmonary effort is normal. No tachypnea, bradypnea, accessory muscle usage or respiratory distress. She is not intubated.     Breath sounds: Normal breath sounds. No stridor. No wheezing or rales.  Chest:     Chest wall: No tenderness.  Abdominal:     General: Bowel sounds are normal. There is no distension or abdominal bruit.     Palpations: Abdomen is soft. There is no shifting dullness, fluid wave, hepatomegaly, splenomegaly, mass or pulsatile mass.     Tenderness: There is no abdominal tenderness. There is no guarding or rebound.     Hernia: No hernia is present.  Musculoskeletal:        General: No tenderness or deformity. Normal range of motion.     Cervical back: Full passive range of motion without pain, normal range of motion and neck supple. No edema, erythema or rigidity. No spinous process tenderness or muscular tenderness. Normal range of motion.  Lymphadenopathy:     Head:     Right side of head: No submental, submandibular, tonsillar, preauricular, posterior auricular or occipital adenopathy.     Left side of head: No submental, submandibular, tonsillar, preauricular, posterior auricular or occipital adenopathy.     Cervical: No cervical adenopathy.     Right cervical: No superficial, deep or posterior cervical adenopathy.    Left cervical: No superficial, deep or posterior cervical adenopathy.     Upper Body:     Right upper body: No supraclavicular or pectoral adenopathy.     Left upper body: No supraclavicular or pectoral adenopathy.  Skin:    General: Skin is warm and dry.     Coloration: Skin is not pale.     Findings: No abrasion, bruising, burn, ecchymosis, erythema, lesion, petechiae or rash.     Nails: There is no clubbing.  Neurological:     Mental Status: She is alert and  oriented to person, place, and time.     GCS: GCS eye subscore is 4. GCS verbal subscore is 5. GCS motor subscore is 6.     Cranial Nerves: No cranial nerve deficit.     Sensory: No sensory deficit.     Motor: No tremor, atrophy, abnormal muscle tone or seizure activity.     Coordination: Coordination normal.     Gait: Gait normal.  Deep Tendon Reflexes: Reflexes are normal and symmetric. Reflexes normal.     Reflex Scores:      Bicep reflexes are 2+ on the right side and 2+ on the left side.      Patellar reflexes are 2+ on the right side and 2+ on the left side. Psychiatric:        Speech: Speech normal.        Behavior: Behavior normal.        Thought Content: Thought content normal.        Judgment: Judgment normal.    BP 118/76    Pulse 100    Ht 5\' 5"  (1.651 m)    Wt 138 lb 12.8 oz (63 kg)    LMP 01/01/2021 (Exact Date)    SpO2 98%    BMI 23.10 kg/m  Wt Readings from Last 3 Encounters:  01/25/21 138 lb 12.8 oz (63 kg)  01/09/21 139 lb 6.4 oz (63.2 kg)  04/24/18 124 lb 6.4 oz (56.4 kg)     Health Maintenance Due  Topic Date Due   HPV VACCINES (1 - 2-dose series) Never done   HIV Screening  Never done   Hepatitis C Screening  Never done   PAP-Cervical Cytology Screening  Never done   PAP SMEAR-Modifier  Never done       Topic Date Due   HPV VACCINES (1 - 2-dose series) Never done    Lab Results  Component Value Date   TSH 0.60 01/09/2021   Lab Results  Component Value Date   WBC 3.5 (L) 01/09/2021   HGB 14.8 01/09/2021   HCT 44.9 01/09/2021   MCV 94.6 01/09/2021   PLT 215.0 01/09/2021   Lab Results  Component Value Date   NA 137 01/09/2021   K 3.7 01/09/2021   CO2 26 01/09/2021   GLUCOSE 75 01/09/2021   BUN 8 01/09/2021   CREATININE 0.77 01/09/2021   BILITOT 1.0 01/09/2021   ALKPHOS 42 01/09/2021   AST 14 01/09/2021   ALT 7 01/09/2021   PROT 7.8 01/09/2021   ALBUMIN 5.0 01/09/2021   CALCIUM 10.2 01/09/2021   GFR 109.25 01/09/2021   Lab  Results  Component Value Date   CHOL 192 01/09/2021   Lab Results  Component Value Date   HDL 47.10 01/09/2021   Lab Results  Component Value Date   LDLCALC 131 (H) 01/09/2021   Lab Results  Component Value Date   TRIG 74.0 01/09/2021   Lab Results  Component Value Date   CHOLHDL 4 01/09/2021   No results found for: HGBA1C    Assessment & Plan:   Problem List Items Addressed This Visit       Digestive   Gastroesophageal reflux disease - Primary   Relevant Medications   pantoprazole (PROTONIX) 20 MG tablet   Other Relevant Orders   CBC with Differential/Platelet     Other   Decreased appetite    Meds ordered this encounter  Medications   pantoprazole (PROTONIX) 20 MG tablet    Sig: Take 1 tablet (20 mg total) by mouth daily.    Dispense:  90 tablet    Refill:  0   Doing well GERD hs improved/ resolved was on 40 mg of Protonix daily will decrease to 20mg  once daily.  Recheck CBC today.  Red Flags discussed. The patient was given clear instructions to go to ER or return to medical center if any red flags develop, symptoms do not improve, worsen or new  problems develop. They verbalized understanding. Diet discussed and strict return precautions.  Follow-up: Return in about 3 months (around 04/25/2021), or if symptoms worsen or fail to improve, for at any time for any worsening symptoms, Go to Emergency room/ urgent care if worse.    Jairo Ben, FNP

## 2021-02-26 ENCOUNTER — Other Ambulatory Visit: Payer: Self-pay

## 2021-02-26 ENCOUNTER — Other Ambulatory Visit (INDEPENDENT_AMBULATORY_CARE_PROVIDER_SITE_OTHER)

## 2021-02-26 DIAGNOSIS — K219 Gastro-esophageal reflux disease without esophagitis: Secondary | ICD-10-CM | POA: Diagnosis not present

## 2021-02-26 LAB — CBC WITH DIFFERENTIAL/PLATELET
Basophils Absolute: 0 10*3/uL (ref 0.0–0.1)
Basophils Relative: 0.8 % (ref 0.0–3.0)
Eosinophils Absolute: 0.1 10*3/uL (ref 0.0–0.7)
Eosinophils Relative: 1.7 % (ref 0.0–5.0)
HCT: 42 % (ref 36.0–46.0)
Hemoglobin: 13.6 g/dL (ref 12.0–15.0)
Lymphocytes Relative: 25.7 % (ref 12.0–46.0)
Lymphs Abs: 1.3 10*3/uL (ref 0.7–4.0)
MCHC: 32.3 g/dL (ref 30.0–36.0)
MCV: 95 fl (ref 78.0–100.0)
Monocytes Absolute: 0.3 10*3/uL (ref 0.1–1.0)
Monocytes Relative: 5.8 % (ref 3.0–12.0)
Neutro Abs: 3.4 10*3/uL (ref 1.4–7.7)
Neutrophils Relative %: 66 % (ref 43.0–77.0)
Platelets: 202 10*3/uL (ref 150.0–400.0)
RBC: 4.42 Mil/uL (ref 3.87–5.11)
RDW: 13.5 % (ref 11.5–15.5)
WBC: 5.1 10*3/uL (ref 4.0–10.5)

## 2021-02-26 NOTE — Progress Notes (Signed)
CBC is within normal limits WBC returned to normal.

## 2021-04-25 ENCOUNTER — Encounter: Payer: Self-pay | Admitting: Adult Health

## 2021-04-25 ENCOUNTER — Ambulatory Visit (INDEPENDENT_AMBULATORY_CARE_PROVIDER_SITE_OTHER): Admitting: Adult Health

## 2021-04-25 ENCOUNTER — Other Ambulatory Visit: Payer: Self-pay

## 2021-04-25 VITALS — BP 112/70 | HR 65 | Temp 97.0°F | Ht 65.0 in | Wt 138.8 lb

## 2021-04-25 DIAGNOSIS — K219 Gastro-esophageal reflux disease without esophagitis: Secondary | ICD-10-CM | POA: Diagnosis not present

## 2021-04-25 DIAGNOSIS — Z1331 Encounter for screening for depression: Secondary | ICD-10-CM

## 2021-04-25 NOTE — Patient Instructions (Addendum)
Health Maintenance, Female ?Adopting a healthy lifestyle and getting preventive care are important in promoting health and wellness. Ask your health care provider about: ?The right schedule for you to have regular tests and exams. ?Things you can do on your own to prevent diseases and keep yourself healthy. ?What should I know about diet, weight, and exercise? ?Eat a healthy diet ? ?Eat a diet that includes plenty of vegetables, fruits, low-fat dairy products, and lean protein. ?Do not eat a lot of foods that are high in solid fats, added sugars, or sodium. ?Maintain a healthy weight ?Body mass index (BMI) is used to identify weight problems. It estimates body fat based on height and weight. Your health care provider can help determine your BMI and help you achieve or maintain a healthy weight. ?Get regular exercise ?Get regular exercise. This is one of the most important things you can do for your health. Most adults should: ?Exercise for at least 150 minutes each week. The exercise should increase your heart rate and make you sweat (moderate-intensity exercise). ?Do strengthening exercises at least twice a week. This is in addition to the moderate-intensity exercise. ?Spend less time sitting. Even light physical activity can be beneficial. ?Watch cholesterol and blood lipids ?Have your blood tested for lipids and cholesterol at 23 years of age, then have this test every 5 years. ?Have your cholesterol levels checked more often if: ?Your lipid or cholesterol levels are high. ?You are older than 23 years of age. ?You are at high risk for heart disease. ?What should I know about cancer screening? ?Depending on your health history and family history, you may need to have cancer screening at various ages. This may include screening for: ?Breast cancer. ?Cervical cancer. ?Colorectal cancer. ?Skin cancer. ?Lung cancer. ?What should I know about heart disease, diabetes, and high blood pressure? ?Blood pressure and heart  disease ?High blood pressure causes heart disease and increases the risk of stroke. This is more likely to develop in people who have high blood pressure readings or are overweight. ?Have your blood pressure checked: ?Every 3-5 years if you are 19-36 years of age. ?Every year if you are 48 years old or older. ?Diabetes ?Have regular diabetes screenings. This checks your fasting blood sugar level. Have the screening done: ?Once every three years after age 18 if you are at a normal weight and have a low risk for diabetes. ?More often and at a younger age if you are overweight or have a high risk for diabetes. ?What should I know about preventing infection? ?Hepatitis B ?If you have a higher risk for hepatitis B, you should be screened for this virus. Talk with your health care provider to find out if you are at risk for hepatitis B infection. ?Hepatitis C ?Testing is recommended for: ?Everyone born from [birth year] through 1967. ?Anyone with known risk factors for hepatitis C. ?Sexually transmitted infections (STIs) ?Get screened for STIs, including gonorrhea and chlamydia, if: ?You are sexually active and are younger than 23 years of age. ?You are older than 23 years of age and your health care provider tells you that you are at risk for this type of infection. ?Your sexual activity has changed since you were last screened, and you are at increased risk for chlamydia or gonorrhea. Ask your health care provider if you are at risk. ?Ask your health care provider about whether you are at high risk for HIV. Your health care provider may recommend a prescription medicine to help prevent HIV  infection. If you choose to take medicine to prevent HIV, you should first get tested for HIV. You should then be tested every 3 months for as long as you are taking the medicine. ?Pregnancy ?If you are about to stop having your period (premenopausal) and you may become pregnant, seek counseling before you get pregnant. ?Take 400 to 800  micrograms (mcg) of folic acid every day if you become pregnant. ?Ask for birth control (contraception) if you want to prevent pregnancy. ?Osteoporosis and menopause ?Osteoporosis is a disease in which the bones lose minerals and strength with aging. This can result in bone fractures. If you are 81 years old or older, or if you are at risk for osteoporosis and fractures, ask your health care provider if you should: ?Be screened for bone loss. ?Take a calcium or vitamin D supplement to lower your risk of fractures. ?Be given hormone replacement therapy (HRT) to treat symptoms of menopause. ?Follow these instructions at home: ?Alcohol use ?Do not drink alcohol if: ?Your health care provider tells you not to drink. ?You are pregnant, may be pregnant, or are planning to become pregnant. ?If you drink alcohol: ?Limit how much you have to: ?0-1 drink a day. ?Know how much alcohol is in your drink. In the [place] one drink equals one 12 oz bottle of beer (355 mL), one 5 oz glass of wine (148 mL), or one 1? oz glass of hard liquor (44 mL). ?Lifestyle ?Do not use any products that contain nicotine or tobacco. These products include cigarettes, chewing tobacco, and vaping devices, such as e-cigarettes. If you need help quitting, ask your health care provider. ?Do not use street drugs. ?Do not share needles. ?Ask your health care provider for help if you need support or information about quitting drugs. ?General instructions ?Schedule regular health, dental, and eye exams. ?Stay current with your vaccines. ?Tell your health care provider if: ?You often feel depressed. ?You have ever been abused or do not feel safe at home. ?Summary ?Adopting a healthy lifestyle and getting preventive care are important in promoting health and wellness. ?Follow your health care provider's instructions about healthy diet, exercising, and getting tested or screened for diseases. ?Follow your health care provider's instructions on monitoring your  cholesterol and blood pressure. ?This information is not intended to replace advice given to you by your health care provider. Make sure you discuss any questions you have with your health care provider. ?Document Revised: 06/19/2020 Document Reviewed: 06/19/2020 ?Elsevier Patient Education ? 2022 [employer] ?Depression Screening ?Depression screening is a tool that your health care provider can use to learn if you have symptoms of depression. Depression is a common condition with many symptoms that are also often found in other conditions. Depression is treatable, but it must first be diagnosed. You may not know that certain feelings, thoughts, and behaviors that you are having can be symptoms of depression. ?Taking a depression screening test can help you and your health care provider decide if you need more assessment, or if you should be referred to a mental health care provider. ?What are the screening tests? ?You may have a physical exam to see if another condition is affecting your mental health. You may have a blood or urine sample taken during the physical exam. ?You may be interviewed or offered a written test using a screening tool that was developed from research, such as one of these: ?Patient Health Questionnaire (PHQ). This is a set of either 2 or 9 questions. A  health care provider who has been trained to score this screening test uses a guide to assess if your symptoms suggest that you may have depression. ?Hamilton Depression Rating Scale (HAM-D). This is a set of either 17 or 24 questions. You may be asked to take it again during or after your treatment, to see if your depression has gotten better. ?Beck Depression Inventory (BDI). This is a set of 21 multiple choice questions. Your health care provider scores your answers to assess: ?Your level of depression, ranging from mild to severe. ?Your response to treatment. ?Your health care provider may talk with you about your daily activities, such  as eating, sleeping, work, and recreation, and ask if you have had any changes in activity. ?Your health care provider may ask you to see a mental health specialist, such as a psychiatrist or psychologist, f

## 2021-04-25 NOTE — Progress Notes (Signed)
? ?Established Patient Office Visit ? ?Subjective:  ?Patient ID: Geronimo RunningAlexica Takayama, female    DOB: 01/06/2001  Age: 21 y.o. MRN: [id number] ? ?CC:  ?Chief Complaint  ?Patient presents with  ? Follow-up  ?  F/u - GERD  ? ? ?HPI ?Geronimo RunningAlexica Negron presents for follow up on GERD she reports she is taking no medication now she discontinued her Protonix. She denies any reflux, hemoptysis or any acid.  ?She feels well.  ? ?Depression scoring was positive however she feels she is doing well mentally, needs no medication and feels fine. She has no concerns and is not taking any medication. She has no reprted suicidal or homicidal ideations or intents.  ? ?Patient  denies any fever, body aches,chills, rash, chest pain, shortness of breath, nausea, vomiting, or diarrhea.  ?Denies dizziness, lightheadedness, pre syncopal or syncopal episodes.  ? ? ?No past medical history on file. ? ?No past surgical history on file. ? ?Family History  ?Problem Relation Age of Onset  ? Depression Mother   ? Hypertension Maternal Grandmother   ? ? ?Social History  ? ?Socioeconomic History  ? Marital status: Single  ?  Spouse name: Not on file  ? Number of children: Not on file  ? Years of education: Not on file  ? Highest education level: Not on file  ?Occupational History  ? Not on file  ?Tobacco Use  ? Smoking status: Never  ? Smokeless tobacco: Never  ?Vaping Use  ? Vaping Use: Never used  ?Substance and Sexual Activity  ? Alcohol use: Never  ? Drug use: Never  ? Sexual activity: Never  ?  Birth control/protection: None  ?Other Topics Concern  ? Not on file  ?Social History Narrative  ? Not on file  ? ?Social Determinants of Health  ? ?Financial Resource Strain: Not on file  ?Food Insecurity: Not on file  ?Transportation Needs: Not on file  ?Physical Activity: Not on file  ?Stress: Not on file  ?Social Connections: Not on file  ?Intimate Partner Violence: Not on file  ? ? ?Outpatient Medications Prior to Visit  ?Medication Sig Dispense Refill  ?  pantoprazole (PROTONIX) 20 MG tablet Take 1 tablet (20 mg total) by mouth daily. (Patient not taking: Reported on 04/25/2021) 90 tablet 0  ? ?No facility-administered medications prior to visit.  ? ? ?No Known Allergies ?Depression screen [facility] 04/25/2021 04/25/2021 01/25/2021 01/09/2021 07/17/2018  ?Decreased Interest 2 2 1 2  0  ?Down, Depressed, Hopeless 2 2 1 2  0  ?PHQ - 2 Score 4 4 2 4  0  ?Altered sleeping 2 - - 3 0  ?Tired, decreased energy 2 - - 2 0  ?Change in appetite 1 - - 2 0  ?Feeling bad or failure about yourself  1 - - 2 0  ?Trouble concentrating 1 - - 3 0  ?Moving slowly or fidgety/restless 1 - - 2 0  ?Suicidal thoughts 0 - - 0 0  ?PHQ-9 Score 12 - - 18 0  ?Difficult doing work/chores Somewhat difficult - - Very difficult Not difficult at all  ? ? ?  ?ROS ?Review of Systems  ?Cardiovascular: Negative.   ?Gastrointestinal: Negative.   ?Genitourinary: Negative.   ?Musculoskeletal: Negative.   ?Skin: Negative.   ?Psychiatric/Behavioral:  Negative for agitation, behavioral problems, confusion, decreased concentration, dysphoric mood, hallucinations, self-injury, sleep disturbance and suicidal ideas. The patient is not nervous/anxious and is not hyperactive.   ? ?  ?Objective:  ?  ? ?General: Appearance:  Well developed, well nourished female in no acute distress  ?Eyes:    PERRL, conjunctiva/corneas clear, EOM's intact       ?Lungs:     Clear to auscultation bilaterally, respirations unlabored  ?Heart:    Normal heart rate. Normal rhythm. No murmurs, rubs, or gallops.  ?  ?MS:   All extremities are intact.  ?  ?Neurologic:   Awake, alert, oriented x 3. No apparent focal neurological           defect.   ? Abdomen: soft and non-tender without masses, organomegaly or hernias noted.  No guarding or rebound  ? ?BP 112/70 (BP Location: Left Arm, Patient Position: Sitting, Cuff Size: Small)   Pulse 65   Temp (!) 97 ?F (36.1 ?C) (Oral)   Ht 5\' 5"  (1.651 m)   Wt 138 lb 12.8 oz (63 kg)   SpO2 99%   BMI 23.10  kg/m?  ?Wt Readings from Last 3 Encounters:  ?04/25/21 138 lb 12.8 oz (63 kg)  ?01/25/21 138 lb 12.8 oz (63 kg)  ?01/09/21 139 lb 6.4 oz (63.2 kg)  ? ? ? ?Health Maintenance Due  ?Topic Date Due  ? HPV VACCINES (1 - 2-dose series) Never done  ? HIV Screening  Never done  ? Hepatitis C Screening  Never done  ? PAP-Cervical Cytology Screening  Never done  ? PAP SMEAR-Modifier  Never done  ? ? ?   ?Topic Date Due  ? HPV VACCINES (1 - 2-dose series) Never done  ? ? ?Lab Results  ?Component Value Date  ? TSH 0.60 01/09/2021  ? ?Lab Results  ?Component Value Date  ? WBC 5.1 02/26/2021  ? HGB 13.6 02/26/2021  ? HCT 42.0 02/26/2021  ? MCV 95.0 02/26/2021  ? PLT 202.0 02/26/2021  ? ?Lab Results  ?Component Value Date  ? NA 137 01/09/2021  ? K 3.7 01/09/2021  ? CO2 26 01/09/2021  ? GLUCOSE 75 01/09/2021  ? BUN 8 01/09/2021  ? CREATININE 0.77 01/09/2021  ? BILITOT 1.0 01/09/2021  ? ALKPHOS 42 01/09/2021  ? AST 14 01/09/2021  ? ALT 7 01/09/2021  ? PROT 7.8 01/09/2021  ? ALBUMIN 5.0 01/09/2021  ? CALCIUM 10.2 01/09/2021  ? GFR 109.25 01/09/2021  ? ?Lab Results  ?Component Value Date  ? CHOL 192 01/09/2021  ? ?Lab Results  ?Component Value Date  ? HDL 47.10 01/09/2021  ? ?Lab Results  ?Component Value Date  ? LDLCALC 131 (H) 01/09/2021  ? ?Lab Results  ?Component Value Date  ? TRIG 74.0 01/09/2021  ? ?Lab Results  ?Component Value Date  ? CHOLHDL 4 01/09/2021  ? ?No results found for: HGBA1C ? ?  ?Assessment & Plan:  ? ?Problem List Items Addressed This Visit   ? ?  ? Digestive  ? Gastroesophageal reflux disease - Primary  ?  ? Other  ? Positive depression screening  ?Reflux seems to have resolved, she has stopped medication and  has no concerns at this time as she feels well and is asymptomatic.  ? ?She has no concerns for depression and anxiety despite positive scoring on screening. Feels well, denies any suicidal or homicidal ideations or intents. Recheck GAD and PHQ 9 in 3 months with new PCP advised. Seek care should you  develop any symptoms. ? ?No orders of the defined types were placed in this encounter. ? ? ?The patient is advised to begin progressive daily aerobic exercise program, follow a low fat, low cholesterol diet, reduce exposure to  stress, improve dietary compliance, use calcium 1 gram daily with Vit D, continue current medications, continue current healthy lifestyle patterns, and return for routine annual checkups.  ? ?Follow-up: Return if symptoms worsen or fail to improve, for at any time for any worsening symptoms, Go to Emergency room/ urgent care if worse.  ? ?Red Flags discussed. The patient was given clear instructions to go to ER or return to medical center if any red flags develop, symptoms do not improve, worsen or new problems develop. They verbalized understanding.  ? ?Patient is aware that his primary care provider is leaving the office and last day will be May 03, 2021 and he will need to establish care with a new primary care provider, list is available upfront for patient to help him with establishing care or they can choose a provider of their choice.Patient verbalized understanding of all instructions given and denies any further questions at this time.   ? ?Jairo Ben, FNP ?

## 2023-02-13 IMAGING — US US ABDOMEN LIMITED
1 series · 14 of 25 positions shown · non-contrast
Comparison: None.

CLINICAL DATA: Quadrant pain

EXAM:
ULTRASOUND ABDOMEN LIMITED RIGHT UPPER QUADRANT

[Series 1: us abdomen limited · 0.19mm/px · 14 of 55 slices shown]
[im 1/55]
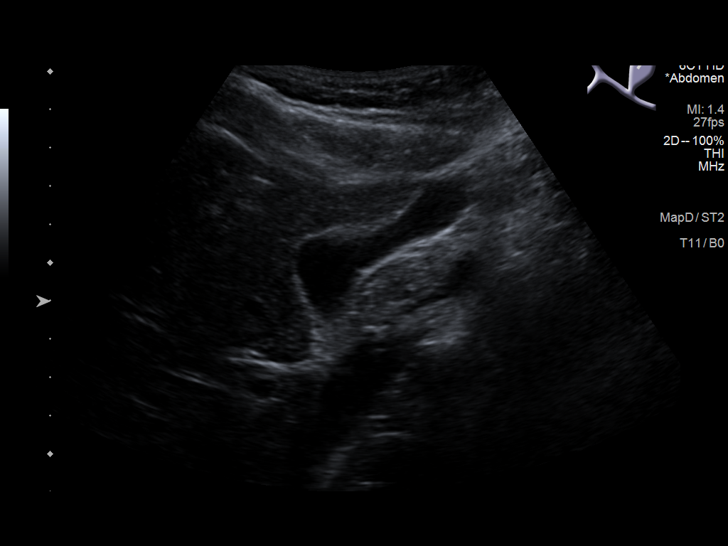
[im 5/55]
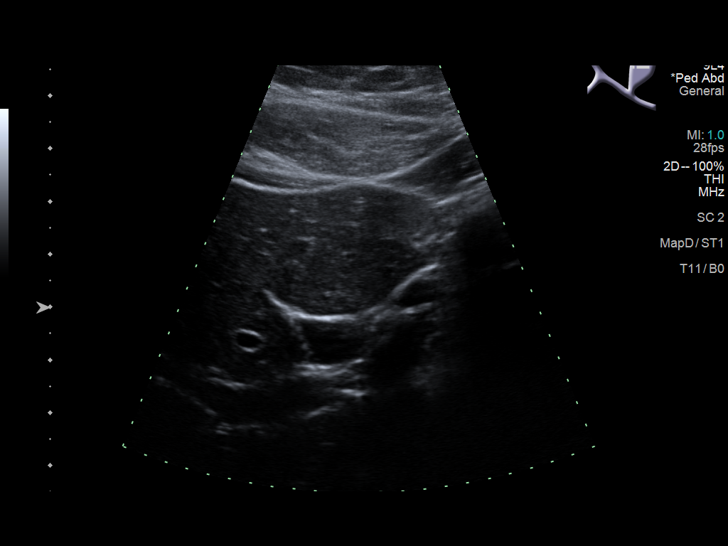
[im 10/55]
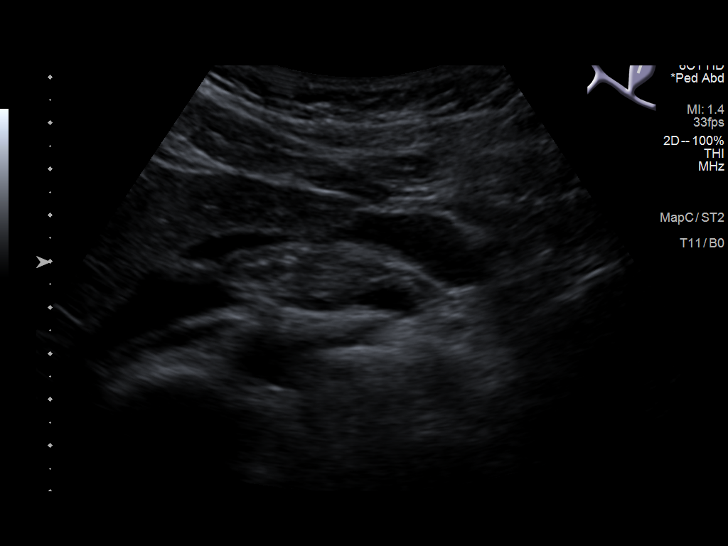
[im 14/55]
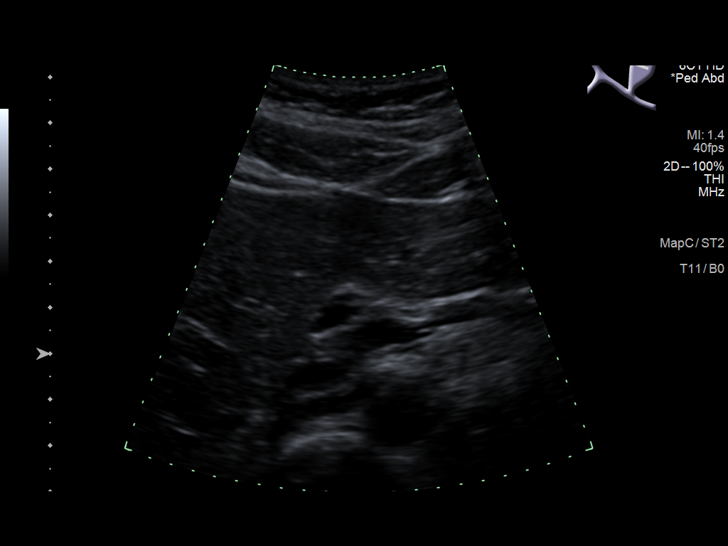
[im 19/55]
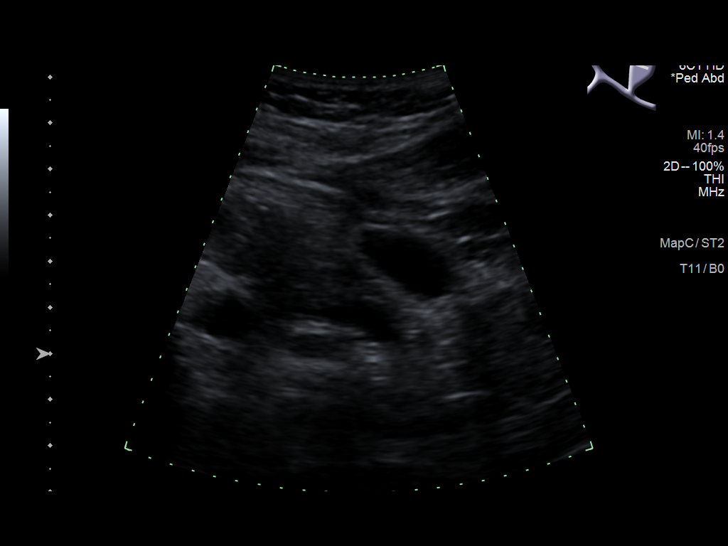
[im 21/55]
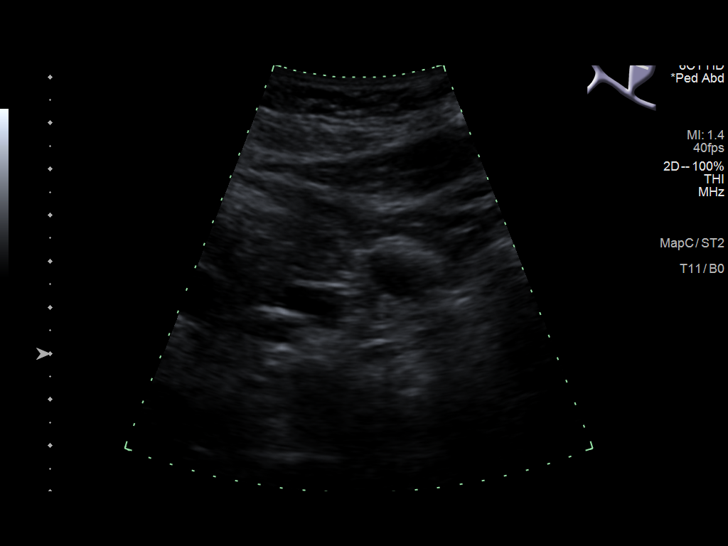
[im 25/55]
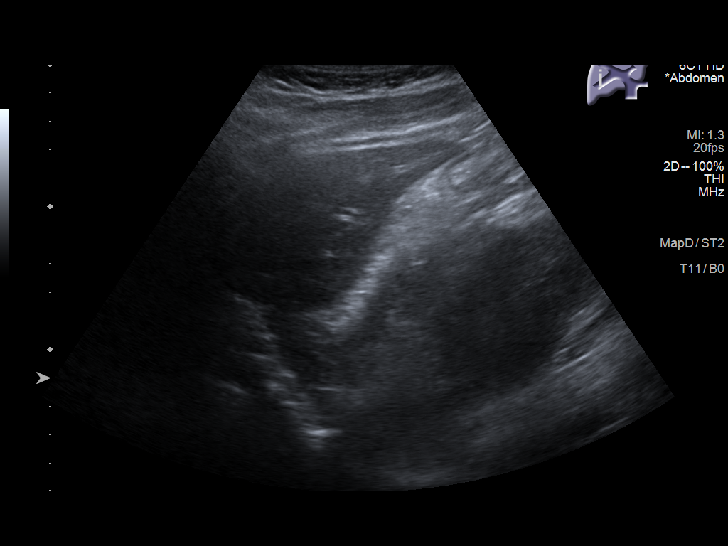
[im 30/55]
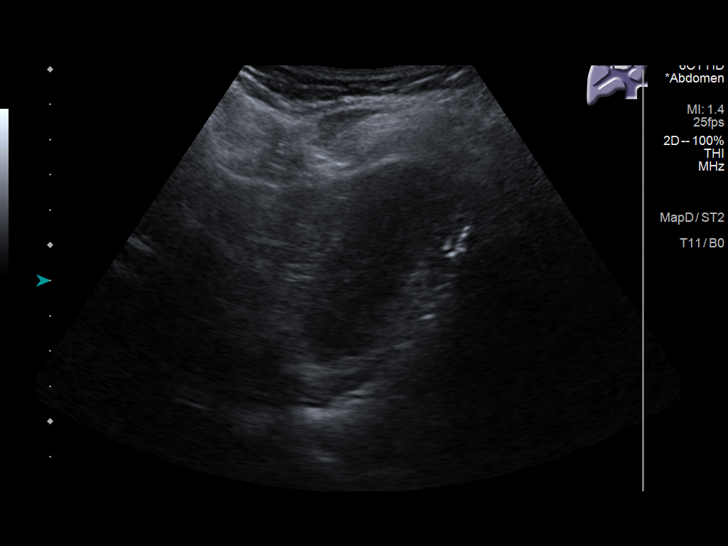
[im 34/55]
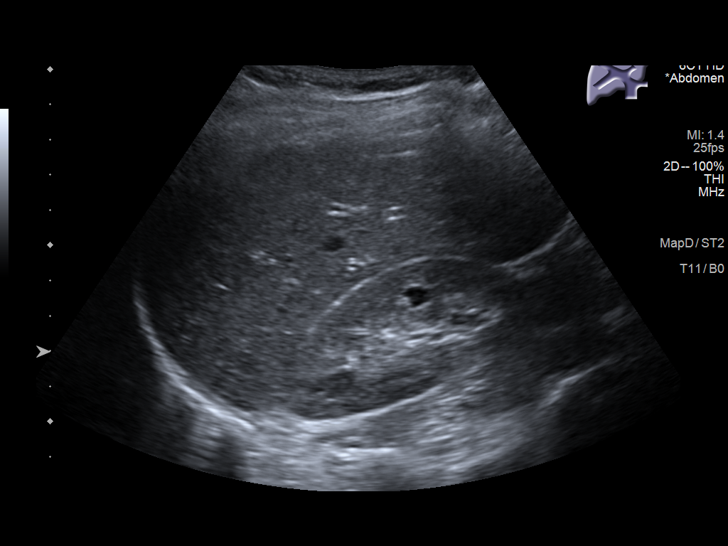
[im 37/55]
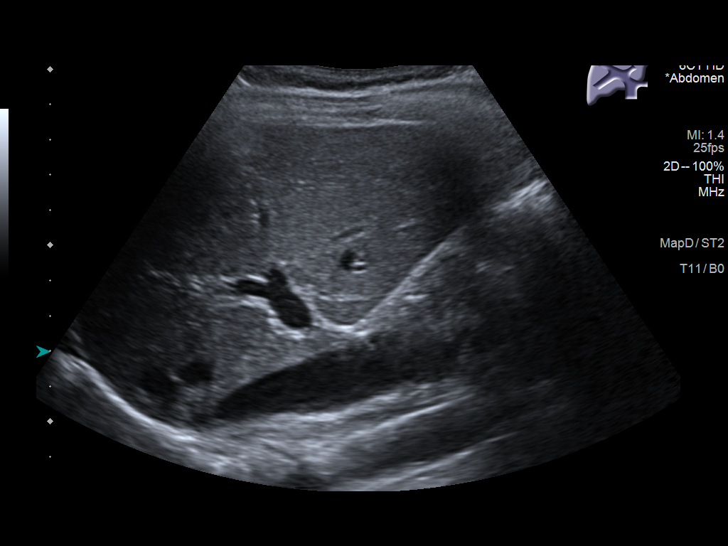
[im 41/55]
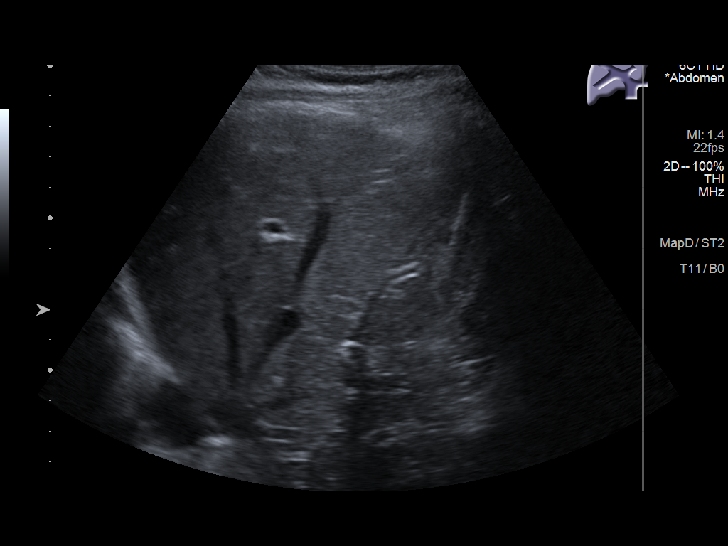
[im 46/55]
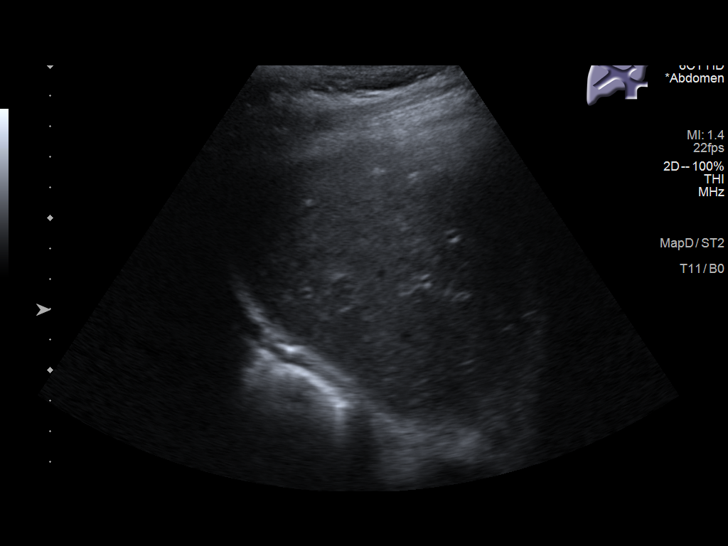
[im 50/55]
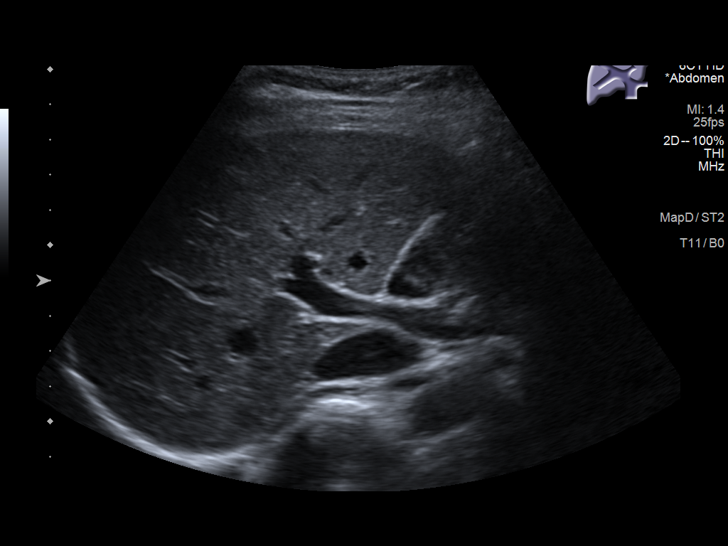
[im 55/55]
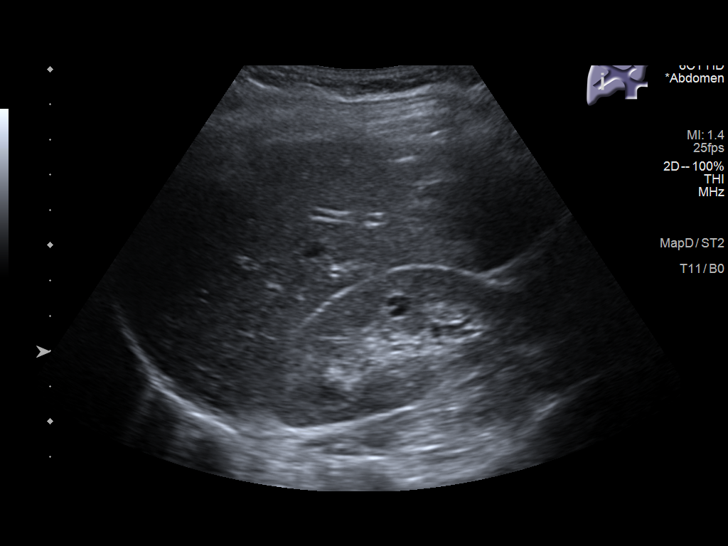

[14 of 25 positions shown; findings below may reference images not displayed]

FINDINGS: Gallbladder:

No gallstones or wall thickening visualized. No sonographic Murphy
sign noted by sonographer.

Common bile duct:

Diameter: 2.1 mm.

Liver:

No focal lesion identified. Within normal limits in parenchymal
echogenicity. Portal vein is patent on color Doppler imaging with
normal direction of blood flow towards the liver.

Other: None.
IMPRESSION: No acute abnormality noted.

## 2024-02-19 ENCOUNTER — Ambulatory Visit: Payer: Self-pay | Admitting: Family Medicine

## 2024-02-19 ENCOUNTER — Ambulatory Visit: Admitting: Family Medicine

## 2024-02-19 ENCOUNTER — Encounter: Payer: Self-pay | Admitting: Family Medicine

## 2024-02-19 VITALS — BP 110/70 | HR 103 | Temp 98.8°F | Ht 66.5 in | Wt 119.0 lb

## 2024-02-19 DIAGNOSIS — R636 Underweight: Secondary | ICD-10-CM | POA: Insufficient documentation

## 2024-02-19 DIAGNOSIS — Z1331 Encounter for screening for depression: Secondary | ICD-10-CM

## 2024-02-19 DIAGNOSIS — Z681 Body mass index (BMI) 19 or less, adult: Secondary | ICD-10-CM | POA: Diagnosis not present

## 2024-02-19 DIAGNOSIS — M7989 Other specified soft tissue disorders: Secondary | ICD-10-CM | POA: Diagnosis not present

## 2024-02-19 DIAGNOSIS — Z13220 Encounter for screening for lipoid disorders: Secondary | ICD-10-CM | POA: Diagnosis not present

## 2024-02-19 DIAGNOSIS — S8012XA Contusion of left lower leg, initial encounter: Secondary | ICD-10-CM | POA: Diagnosis not present

## 2024-02-19 LAB — COMPREHENSIVE METABOLIC PANEL WITH GFR
ALT: 8 U/L (ref 3–35)
AST: 16 U/L (ref 5–37)
Albumin: 4.7 g/dL (ref 3.5–5.2)
Alkaline Phosphatase: 49 U/L (ref 39–117)
BUN: 11 mg/dL (ref 6–23)
CO2: 30 meq/L (ref 19–32)
Calcium: 9.4 mg/dL (ref 8.4–10.5)
Chloride: 105 meq/L (ref 96–112)
Creatinine, Ser: 0.69 mg/dL (ref 0.40–1.20)
GFR: 120.26 mL/min
Glucose, Bld: 80 mg/dL (ref 70–99)
Potassium: 3.8 meq/L (ref 3.5–5.1)
Sodium: 141 meq/L (ref 135–145)
Total Bilirubin: 0.5 mg/dL (ref 0.2–1.2)
Total Protein: 7.6 g/dL (ref 6.0–8.3)

## 2024-02-19 LAB — CBC WITH DIFFERENTIAL/PLATELET
Basophils Absolute: 0 K/uL (ref 0.0–0.1)
Basophils Relative: 1.2 % (ref 0.0–3.0)
Eosinophils Absolute: 0.1 K/uL (ref 0.0–0.7)
Eosinophils Relative: 2.9 % (ref 0.0–5.0)
HCT: 41 % (ref 36.0–46.0)
Hemoglobin: 13.6 g/dL (ref 12.0–15.0)
Lymphocytes Relative: 39.7 % (ref 12.0–46.0)
Lymphs Abs: 1.6 K/uL (ref 0.7–4.0)
MCHC: 33.2 g/dL (ref 30.0–36.0)
MCV: 93.6 fl (ref 78.0–100.0)
Monocytes Absolute: 0.3 K/uL (ref 0.1–1.0)
Monocytes Relative: 8.6 % (ref 3.0–12.0)
Neutro Abs: 1.9 K/uL (ref 1.4–7.7)
Neutrophils Relative %: 47.6 % (ref 43.0–77.0)
Platelets: 237 K/uL (ref 150.0–400.0)
RBC: 4.39 Mil/uL (ref 3.87–5.11)
RDW: 13.4 % (ref 11.5–15.5)
WBC: 4 K/uL (ref 4.0–10.5)

## 2024-02-19 LAB — PROTIME-INR
INR: 1 ratio (ref 0.8–1.0)
Prothrombin Time: 10.8 s (ref 9.6–13.1)

## 2024-02-19 LAB — LIPID PANEL
Cholesterol: 178 mg/dL (ref 28–200)
HDL: 66.2 mg/dL
LDL Cholesterol: 94 mg/dL (ref 10–99)
NonHDL: 111.55
Total CHOL/HDL Ratio: 3
Triglycerides: 89 mg/dL (ref 10.0–149.0)
VLDL: 17.8 mg/dL (ref 0.0–40.0)

## 2024-02-19 LAB — TSH: TSH: 0.79 u[IU]/mL (ref 0.35–5.50)

## 2024-02-19 NOTE — Assessment & Plan Note (Signed)
 Encouraged patient to keep up with protein intake and increase frequency or size of meals.

## 2024-02-19 NOTE — Progress Notes (Signed)
 "   Patient ID: Megan Burke, female    DOB: September 27, 2000, 24 y.o.   MRN: [id number]  This visit was conducted in person.  BP 110/70 (BP Location: Left Arm, Patient Position: Sitting, Cuff Size: Normal)   Pulse (!) 103   Temp 98.8 F (37.1 C) (Oral)   Ht 5' 6.5 (1.689 m)   Wt 119 lb (54 kg)   LMP 02/04/2024 (Approximate)   SpO2 99%   BMI 18.92 kg/m    CC:  Chief Complaint  Patient presents with   Transfer of Care    Previous pt of Megan Sinner, NP [facility].  Left lower leg and foot swelling for awhile. No known injury.     Subjective:   HPI: Megan Burke is a 24 y.o. female presenting on 02/19/2024 for Transfer of Care (Previous pt of Megan Sinner, NP [facility]. /Left lower leg and foot swelling for awhile. No known injury. )   Previous PCP: Megan Sinner, NP [name].   Last CPX 2020   GERD:  Previously on pantoprazole .. did not help much but went away on its own.   Has noted chronic left lower leg swelling, no known past injury. Had noted some as teenager.SABRA off and on but now  in last 3 months constant swelling in left  foot and ankle to mid shin   Some better with elevation but does not resolved complete always somewhat swollen.  Pain in foot and ankle, off and on.  Some swelling at times in right foot as well but this resolves with elevation.  No varicose veins.   Noted bruising in left foot first week of December.     No bleeding disorders.  Some fatigue but work a lot.   No chest pain, no SOB. No  easy bruising , no gum bleeds, no nose bleeds.   History of dancing.    She works in chiropractor.  Some issue sleeping.  Minimal stress.     02/19/2024    1:17 PM 02/19/2024   12:20 PM 04/25/2021   10:22 AM  Depression screen PHQ 2/9  Decreased Interest 1 0 2  Down, Depressed, Hopeless 0 0 2  PHQ - 2 Score 1 0 4  Altered sleeping 3  2  Tired, decreased energy 3  2  Change in appetite 2  1  Feeling bad or  failure about yourself  0  1  Trouble concentrating 2  1  Moving slowly or fidgety/restless 0  1  Suicidal thoughts 0  0  PHQ-9 Score 11  12   Difficult doing work/chores Not difficult at all  Somewhat difficult     Data saved with a previous flowsheet row definition        Relevant past medical, surgical, family and social history reviewed and updated as indicated. Interim medical history since our last visit reviewed. Allergies and medications reviewed and updated. Outpatient Medications Prior to Visit  Medication Sig Dispense Refill   pantoprazole  (PROTONIX ) 20 MG tablet Take 1 tablet (20 mg total) by mouth daily. (Patient not taking: Reported on 04/25/2021) 90 tablet 0   No facility-administered medications prior to visit.     Per HPI unless specifically indicated in ROS section below Review of Systems  Constitutional:  Negative for fatigue and fever.  HENT:  Negative for congestion.   Eyes:  Negative for pain.  Respiratory:  Negative for cough and shortness of breath.   Cardiovascular:  Positive for leg swelling. Negative for chest  pain and palpitations.  Gastrointestinal:  Negative for abdominal pain.  Genitourinary:  Negative for dysuria and vaginal bleeding.  Musculoskeletal:  Negative for back pain.  Neurological:  Negative for syncope, light-headedness and headaches.  Psychiatric/Behavioral:  Negative for dysphoric mood.    Objective:  BP 110/70 (BP Location: Left Arm, Patient Position: Sitting, Cuff Size: Normal)   Pulse (!) 103   Temp 98.8 F (37.1 C) (Oral)   Ht 5' 6.5 (1.689 m)   Wt 119 lb (54 kg)   LMP 02/04/2024 (Approximate)   SpO2 99%   BMI 18.92 kg/m   Wt Readings from Last 3 Encounters:  02/19/24 119 lb (54 kg)  04/25/21 138 lb 12.8 oz (63 kg)  01/25/21 138 lb 12.8 oz (63 kg)      Physical Exam Constitutional:      General: She is not in acute distress.    Appearance: Normal appearance. She is well-developed. She is not ill-appearing or  toxic-appearing.  HENT:     Head: Normocephalic.     Right Ear: Hearing, tympanic membrane, ear canal and external ear normal. Tympanic membrane is not erythematous, retracted or bulging.     Left Ear: Hearing, tympanic membrane, ear canal and external ear normal. Tympanic membrane is not erythematous, retracted or bulging.     Nose: No mucosal edema or rhinorrhea.     Right Sinus: No maxillary sinus tenderness or frontal sinus tenderness.     Left Sinus: No maxillary sinus tenderness or frontal sinus tenderness.     Mouth/Throat:     Pharynx: Uvula midline.  Eyes:     General: Lids are normal. Lids are everted, no foreign bodies appreciated.     Conjunctiva/sclera: Conjunctivae normal.     Pupils: Pupils are equal, round, and reactive to light.  Neck:     Thyroid: No thyroid mass or thyromegaly.     Vascular: No carotid bruit.     Trachea: Trachea normal.  Cardiovascular:     Rate and Rhythm: Normal rate and regular rhythm.     Pulses: Normal pulses. No decreased pulses.          Popliteal pulses are 2+ on the right side and 2+ on the left side.       Dorsalis pedis pulses are 2+ on the right side and 2+ on the left side.       Posterior tibial pulses are 2+ on the right side and 2+ on the left side.     Heart sounds: Normal heart sounds, S1 normal and S2 normal. No murmur heard.    No friction rub. No gallop.     Comments:  No calf tenderness Pulmonary:     Effort: Pulmonary effort is normal. No tachypnea or respiratory distress.     Breath sounds: Normal breath sounds. No decreased breath sounds, wheezing, rhonchi or rales.  Abdominal:     General: Bowel sounds are normal.     Palpations: Abdomen is soft.     Tenderness: There is no abdominal tenderness.  Musculoskeletal:     Cervical back: Normal range of motion and neck supple.     Right lower leg: No edema.     Left lower leg: 2+ Edema present.  Skin:    General: Skin is warm and dry.     Findings: No rash.   Neurological:     Mental Status: She is alert.  Psychiatric:        Mood and Affect: Mood is not anxious or depressed.  Speech: Speech normal.        Behavior: Behavior normal. Behavior is cooperative.        Thought Content: Thought content normal.        Judgment: Judgment normal.       Results for orders placed or performed in visit on 02/26/21  CBC with Differential/Platelet   Collection Time: 02/26/21  9:52 AM  Result Value Ref Range   WBC 5.1 4.0 - 10.5 K/uL   RBC 4.42 3.87 - 5.11 Mil/uL   Hemoglobin 13.6 12.0 - 15.0 g/dL   HCT 57.9 63.9 - 53.9 %   MCV 95.0 78.0 - 100.0 fl   MCHC 32.3 30.0 - 36.0 g/dL   RDW 86.4 88.4 - 84.4 %   Platelets 202.0 150.0 - 400.0 K/uL   Neutrophils Relative % 66.0 43.0 - 77.0 %   Lymphocytes Relative 25.7 12.0 - 46.0 %   Monocytes Relative 5.8 3.0 - 12.0 %   Eosinophils Relative 1.7 0.0 - 5.0 %   Basophils Relative 0.8 0.0 - 3.0 %   Neutro Abs 3.4 1.4 - 7.7 K/uL   Lymphs Abs 1.3 0.7 - 4.0 K/uL   Monocytes Absolute 0.3 0.1 - 1.0 K/uL   Eosinophils Absolute 0.1 0.0 - 0.7 K/uL   Basophils Absolute 0.0 0.0 - 0.1 K/uL    Assessment and Plan  Underweight with body mass index (BMI) less than 18.5 Assessment & Plan: Encouraged patient to keep up with protein intake and increase frequency or size of meals.   Left leg swelling Assessment & Plan: Chronic, worsening Severe swelling in left lower extremity without associated pain in low BMI patient. Given chronicity of swelling I do not clearly feel that DVT is likely but we will evaluate with Dopplers to make sure. Will evaluate for other causes of swelling with lab work including complete metabolic panel, TSH.  No murmur noted. Will go ahead and refer to vascular given significant concern for venous reflux or possible May-Thurner syndrome.  Encouraged patient to wear compression hose when up on feet.    Orders: -     Comprehensive metabolic panel with GFR -     TSH -     VAS US   LOWER EXTREMITY VENOUS REFLUX; Future -     Ambulatory referral to Vascular Surgery  Contusion of left lower leg, initial encounter Assessment & Plan:  Acute... 2 episodes.  Patient has picture showing extensive redness and bruising on the left lateral ankle and dorsal foot as well as severe 3+ swelling in left lower leg. Will evaluate with labs for clotting disorder but given no additional easy bruising, epistaxis or gum bleeding more likely contusion from friction given severe swelling left lower leg.  Orders: -     CBC with Differential/Platelet -     Protime-INR  Screening cholesterol level -     Lipid panel  Positive depression screening Assessment & Plan:  Again screening positive but pt denies concern.  Feels it is due to working 2 jobs.     Return in about 3 months (around 05/19/2024) for annual physical .   Greig Ring, MD  "

## 2024-02-19 NOTE — Assessment & Plan Note (Signed)
"   Again screening positive but pt denies concern.  Feels it is due to working 2 jobs. "

## 2024-02-19 NOTE — Assessment & Plan Note (Signed)
"   Acute... 2 episodes.  Patient has picture showing extensive redness and bruising on the left lateral ankle and dorsal foot as well as severe 3+ swelling in left lower leg. Will evaluate with labs for clotting disorder but given no additional easy bruising, epistaxis or gum bleeding more likely contusion from friction given severe swelling left lower leg. "

## 2024-02-19 NOTE — Assessment & Plan Note (Signed)
 Chronic, worsening Severe swelling in left lower extremity without associated pain in low BMI patient. Given chronicity of swelling I do not clearly feel that DVT is likely but we will evaluate with Dopplers to make sure. Will evaluate for other causes of swelling with lab work including complete metabolic panel, TSH.  No murmur noted. Will go ahead and refer to vascular given significant concern for venous reflux or possible May-Thurner syndrome.  Encouraged patient to wear compression hose when up on feet.

## 2024-03-25 ENCOUNTER — Encounter (INDEPENDENT_AMBULATORY_CARE_PROVIDER_SITE_OTHER): Payer: Self-pay

## 2024-03-25 ENCOUNTER — Encounter (INDEPENDENT_AMBULATORY_CARE_PROVIDER_SITE_OTHER): Payer: Self-pay | Admitting: Vascular Surgery

## 2024-05-20 ENCOUNTER — Encounter: Admitting: Family Medicine
